# Patient Record
Sex: Male | Born: 1980 | Hispanic: Yes | Marital: Single | Smoking: Current every day smoker
Health system: Southern US, Community
[De-identification: ages and names within clinical notes are randomized; demographics above are authoritative.]

---

## 2015-08-15 ENCOUNTER — Emergency Department: Payer: Self-pay

## 2015-08-15 ENCOUNTER — Encounter: Payer: Self-pay | Admitting: Emergency Medicine

## 2015-08-15 ENCOUNTER — Emergency Department
Admission: EM | Admit: 2015-08-15 | Discharge: 2015-08-15 | Disposition: A | Discharge status: Home / Self Care | Payer: Self-pay | Attending: Emergency Medicine | Admitting: Emergency Medicine

## 2015-08-15 DIAGNOSIS — W108XXA Fall (on) (from) other stairs and steps, initial encounter: Secondary | ICD-10-CM | POA: Insufficient documentation

## 2015-08-15 DIAGNOSIS — S63281A Dislocation of proximal interphalangeal joint of left index finger, initial encounter: Secondary | ICD-10-CM | POA: Insufficient documentation

## 2015-08-15 DIAGNOSIS — F17210 Nicotine dependence, cigarettes, uncomplicated: Secondary | ICD-10-CM | POA: Insufficient documentation

## 2015-08-15 DIAGNOSIS — Y9301 Activity, walking, marching and hiking: Secondary | ICD-10-CM | POA: Insufficient documentation

## 2015-08-15 DIAGNOSIS — S63279A Dislocation of unspecified interphalangeal joint of unspecified finger, initial encounter: Secondary | ICD-10-CM

## 2015-08-15 DIAGNOSIS — S6991XA Unspecified injury of right wrist, hand and finger(s), initial encounter: Secondary | ICD-10-CM | POA: Insufficient documentation

## 2015-08-15 DIAGNOSIS — Y998 Other external cause status: Secondary | ICD-10-CM | POA: Insufficient documentation

## 2015-08-15 DIAGNOSIS — Y9289 Other specified places as the place of occurrence of the external cause: Secondary | ICD-10-CM | POA: Insufficient documentation

## 2015-08-15 MED ORDER — OXYCODONE-ACETAMINOPHEN 5-325 MG PO TABS
1.0000 | ORAL_TABLET | Freq: Once | ORAL | Status: AC
  Administered 2015-08-15: 1 via ORAL
  Filled 2015-08-15: qty 1

## 2015-08-15 MED ORDER — TRAMADOL HCL 50 MG PO TABS: 50.0000 mg | ORAL_TABLET | Freq: Four times a day (QID) | ORAL | Status: AC | PRN

## 2015-08-15 MED ORDER — LIDOCAINE HCL (PF) 1 % IJ SOLN
10.0000 mL | Freq: Once | INTRAMUSCULAR | Status: AC
  Administered 2015-08-15: 10 mL
  Filled 2015-08-15: qty 10

## 2015-08-15 MED ORDER — NAPROXEN 500 MG PO TABS: 500.0000 mg | ORAL_TABLET | Freq: Two times a day (BID) | ORAL | Status: AC

## 2015-08-15 NOTE — ED Notes (Addendum)
Pt fell on left index finger this morning about 15 mins PTA.  Obvious finger deformity noted. Pt reports slipping on wooden stairs that were wet this morning and fell on his hand.

## 2015-08-15 NOTE — Discharge Instructions (Signed)
Finger Dislocation Finger dislocation is the displacement of bones in your finger at the joints. Most commonly, finger dislocation occurs at the proximal interphalangeal joint (the joint closest to your knuckle). Very strong, fibrous tissues (ligaments) and joint capsules connect the three bones of your fingers.  CAUSES Dislocation is caused by a forceful impact. This impact moves these bones off the joint and often tears your ligaments.  SYMPTOMS Symptoms of finger dislocation include:  Deformity of your finger.  Pain, with loss of movement. DIAGNOSIS  Finger dislocation is diagnosed with a physical exam. Often, X-ray exams are done to see if you have associated injuries, such as bone fractures. TREATMENT  Finger dislocations are treated by putting your bones back into position (reduction) either by manually moving the bones back into place or through surgery. Your finger is then kept in a fixed position (immobilized) with the use of a dressing or splint for a brief period. When your ligament has to be surgically repaired, it needs to be kept in a fixed position with a dressing or splint for 1 to 2 weeks. Because joint stiffness is a long-term complication of finger dislocation, hand exercises or physical therapy to increase the range of motion and to regain strength is usually started as soon as the ligament is healed. Exercises and therapy generally last no more than 3 months. HOME CARE INSTRUCTIONS The following measures can help to reduce pain and speed up the healing process:  Rest your injured joint. Do not move until instructed otherwise by your caregiver. Avoid activities similar to the one that caused your injury.  Apply ice to your injured joint for the first day or 2 after your reduction or as directed by your caregiver. Applying ice helps to reduce inflammation and pain.  Put ice in a plastic bag.  Place a towel between your skin and the bag.  Leave the ice on for 15-20 minutes  at a time, every 2 hours while you are awake.  Elevate your hand above your heart as directed by your caregiver to reduce swelling.  Take over-the-counter or prescription medicine for pain as your caregiver instructs you. SEEK IMMEDIATE MEDICAL CARE IF:  Your dressing or splint becomes damaged.  Your pain becomes worse rather than better.  You lose feeling in your finger, or it becomes cold and white. MAKE SURE YOU:  Understand these instructions.  Will watch your condition.  Will get help right away if you are not doing well or get worse.   This information is not intended to replace advice given to you by your health care provider. Make sure you discuss any questions you have with your health care provider.   Document Released: 07/28/2000 Document Revised: 08/21/2014 Document Reviewed: 12/25/2014 Elsevier Interactive Patient Education 2016 [employer]  

## 2015-08-15 NOTE — ED Provider Notes (Signed)
[facility] Emergency Department Provider Note ?  ? ____________________________________________ ? Time seen: 10:38 AM ? I have reviewed the triage vital signs and the nursing notes.  ________ HISTORY ? Chief Complaint Finger Injury     HPI  Jack Matthews is a 35 y.o. male   who presents emergency department complaining of left index finger pain. He states that he was walking on Wednesdays when he slipped, falling on an outstretched hand. He reports hitting the end of his right index finger. He states that he has an obvious deformity and pain to the PIP joint. Patient also endorses some moderate right wrist pain. He denies numbness or tingling to the digits. Patient rates he did not hit his head or lose consciousness. No other injury or complaint at this time. ? ? ? History reviewed. No pertinent past medical history.  There are no active problems to display for this patient.  ? History reviewed. No pertinent past surgical history. ? Current Outpatient Rx  Name  Route  Sig  Dispense  Refill  . naproxen (NAPROSYN) 500 MG tablet   Oral   Take 1 tablet (500 mg total) by mouth 2 (two) times daily with a meal.   60 tablet   0   . traMADol (ULTRAM) 50 MG tablet   Oral   Take 1 tablet (50 mg total) by mouth every 6 (six) hours as needed.   10 tablet   0    ? Allergies Review of patient's allergies indicates no known allergies. ? History reviewed. No pertinent family history. ? Social History Social History  Substance Use Topics  . Smoking status: Current Every Day Smoker -- 0.50 packs/day    Types: Cigarettes  . Smokeless tobacco: None  . Alcohol Use: Yes   ? Review of Systems Constitutional: no fever. Eyes: no discharge ENT: no sore throat. Cardiovascular: no chest pain. Respiratory: no cough. No sob Gastrointestinal: denies abdominal pain, vomiting, diarrhea, and constipation Genitourinary: no dysuria. Negative for  hematuria Musculoskeletal: Negative for back pain. Endorses left index finger pain. Endorses right wrist pain. Skin: Negative for rash. Neurological: Negative for headaches  10-point ROS otherwise negative.  _______________ PHYSICAL EXAM: ? VITAL SIGNS:   ED Triage Vitals  Enc Vitals Group     BP 08/15/15 0805 127/86 mmHg     Pulse Rate 08/15/15 0805 118     Resp 08/15/15 0805 22     Temp 08/15/15 0805 98 F (36.7 C)     Temp Source 08/15/15 0805 Oral     SpO2 08/15/15 0805 95 %     Weight 08/15/15 0805 182 lb (82.555 kg)     Height 08/15/15 0805 6\' 1"  (1.854 m)     Head Cir --      Peak Flow --      Pain Score 08/15/15 0806 10     Pain Loc --      Pain Edu? --      Excl. in GC? --    ?  Constitutional: Alert and oriented. Well appearing and in no distress. Eyes: Conjunctivae are normal.  ENT      Head: Normocephalic and atraumatic.      Ears:       Nose: No congestion/rhinnorhea.      Mouth/Throat: Mucous membranes are moist.   Hematological/Lymphatic/Immunilogical: No cervical lymphadenopathy. Cardiovascular: Normal rate, regular rhythm. Normal S1 and S2. Respiratory: Normal respiratory effort without tachypnea nor retractions. Lungs CTAB. Gastrointestinal: Soft and nontender. No distention. There  is no CVA tenderness. Genitourinary:  Musculoskeletal: Nontender with normal range of motion in all extremities. Left index finger is obviously deformed upon initial inspection. Deformity is just proximal to the PIP joint. Sensation and capillary refill are intact at the distal aspect of the finger. No tenderness to palpation over the carpal or metacarpal bones. Palpable abnormality over the distal proximal phalanx. Physical deformity to right wrist and inspection. Patient is mildly tender to palpation over the proximal carpal bones. No palpable abnormality. Full range of motion of wrist. Capillary refill and sensation are intact 5 digits. Good radial pulse.  Neurologic:   Normal speech and language. No gross focal neurologic deficits are appreciated. Skin:  Skin is warm, dry and intact. No rash noted. Psychiatric: Mood and affect are normal. Speech and behavior are normal. Patient exhibits appropriate insight and judgment.    LABS (all labs ordered are listed, but only abnormal results are displayed)  Labs Reviewed - No data to display  ___________ RADIOLOGY  Left index finger x-ray Impression: Posterior dislocation in the middle phalanx relative to the proximal phalanx of left index finger.   Left index finger x-ray; postreduction films Impression: Successful reduction of dislocation involving the phalanx of the left index finger.  Right wrist x-ray Impression: No acute osseous abnormality.   I have personally reviewed the images. _____________ PROCEDURES ? Procedure(s) performed:    Index Finger Reduction Digital block was performed using 1% lidocaine. 7 ML's of lidocaine were used. Good anesthesia to finger. Distal traction was applied and middle phalanx was guided back into proper anatomical position. Good range of motion to digit status post reduction. Patient tolerated procedure well. Index and third digits are buddy taped to provide stability. Postreduction films are ordered. Finger is splinted in place.   Medications  oxyCODONE-acetaminophen (PERCOCET/ROXICET) 5-325 MG per tablet 1 tablet (1 tablet Oral Given 08/15/15 0818)  lidocaine (PF) (XYLOCAINE) 1 % injection 10 mL (10 mLs Infiltration Given by Other 08/15/15 1024)    ______________________________________________________ INITIAL IMPRESSION / ASSESSMENT AND PLAN / ED COURSE ? Pertinent labs & imaging results that were available during my care of the patient were reviewed by me and considered in my medical decision making (see chart for details).    She presents emergency Department with left index finger pain. Obvious deformity was noted. X-rays reveal dislocation of the middle  phalanx. Patient received a digital block, and finger was manually reduced. Postreduction films reveal successful reduction of dislocation. After successful reduction of the distracting injury patient endorsed right wrist pain. There is no obvious abnormality to palpation. X-ray was performed and revealed no acute osseous abnormality. Patient's index finger is splinted in the emergency department and he is given instructions to wear splint for a week. Patient being slightly return to normal activity with hand. There is any continual symptoms patient is to follow-up with orthopedics.    Discharge Medication List as of 08/15/2015 10:15 AM    START taking these medications   Details  naproxen (NAPROSYN) 500 MG tablet Take 1 tablet (500 mg total) by mouth 2 (two) times daily with a meal., Starting 08/15/2015, Until Discontinued, Print    traMADol (ULTRAM) 50 MG tablet Take 1 tablet (50 mg total) by mouth every 6 (six) hours as needed., Starting 08/15/2015, Until Discontinued, Print       ____________________________________________ FINAL CLINICAL IMPRESSION(S) / ED DIAGNOSES?  Final diagnoses:  Dislocation of finger, interphalangeal joint, left, closed, initial encounter  Delorise Royals Haidan Nhan, PA-C 08/15/15 1038  Jennye Moccasin, MD 08/15/15 [phone]

## 2017-01-30 IMAGING — CR DG FINGER INDEX 2+V*L*
1 series · 3 of 3 positions shown · non-contrast
Comparison: None.

CLINICAL DATA: Left index finger pain and deformity after fall last
night.

EXAM:
LEFT INDEX FINGER 2+V

[Series 1: pa · 0.17mm/px · 3 of 3 slices shown]
[im 1/3]
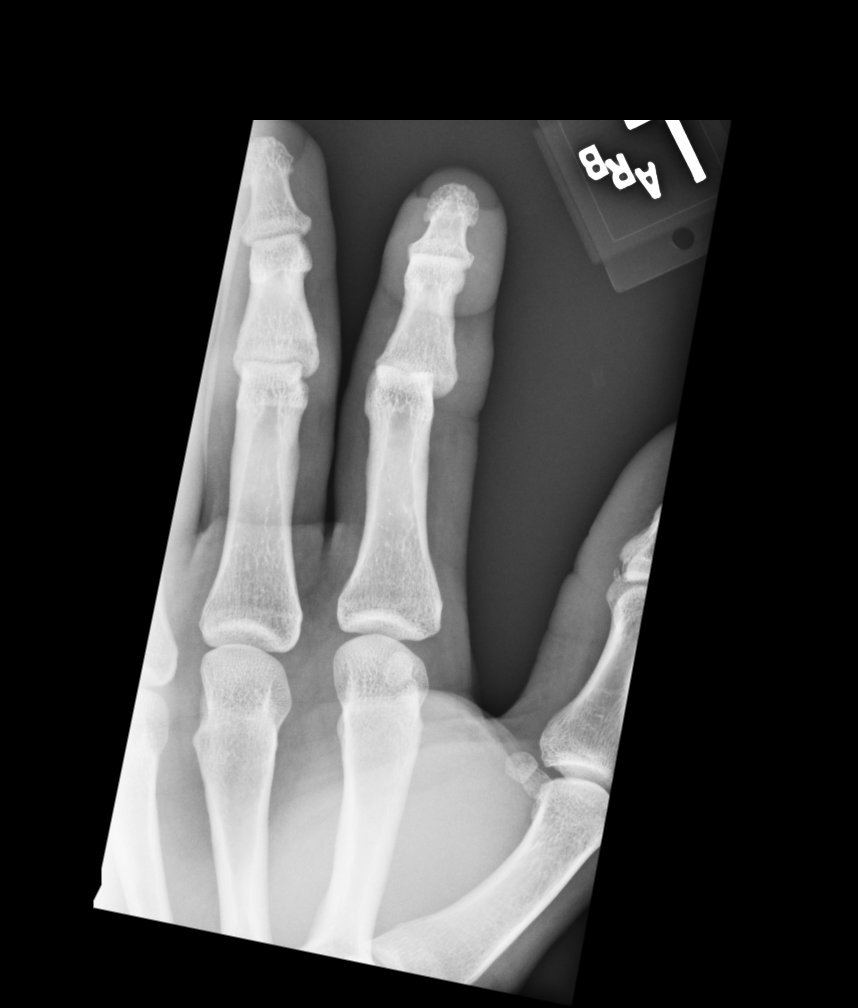
[im 2/3]
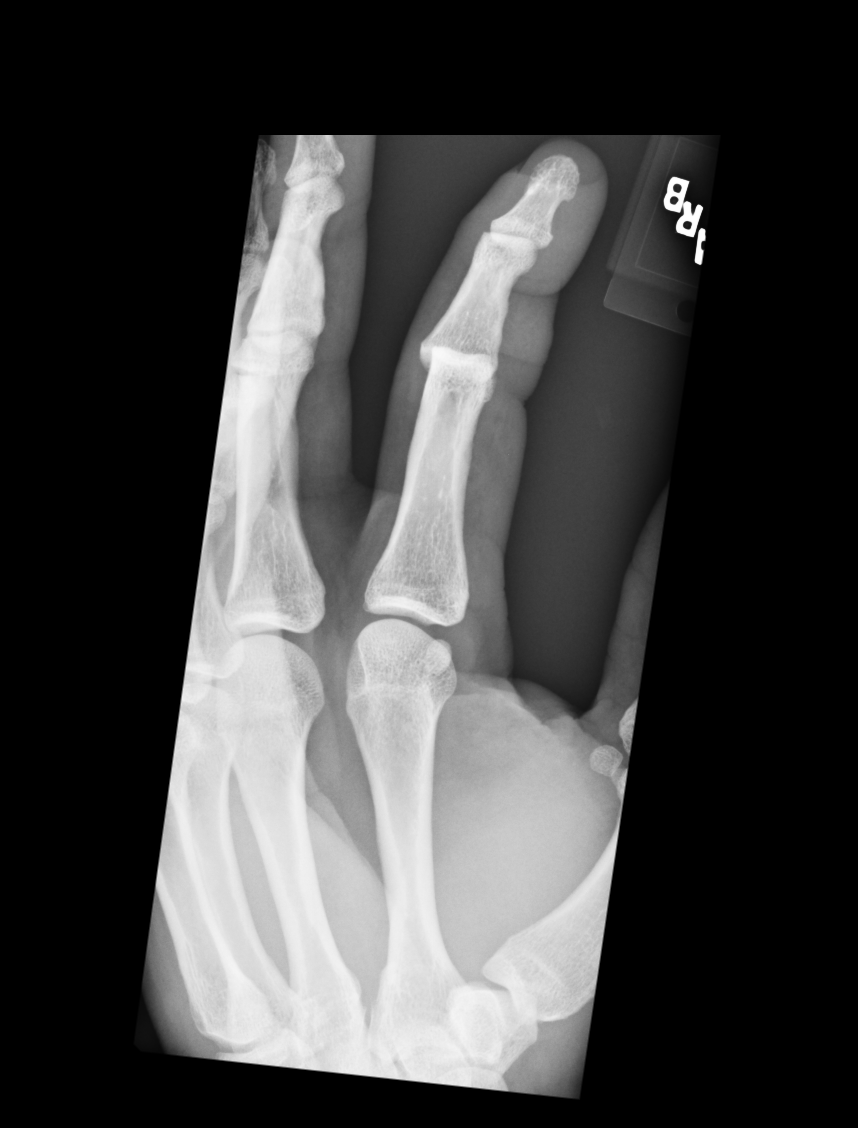
[im 3/3]
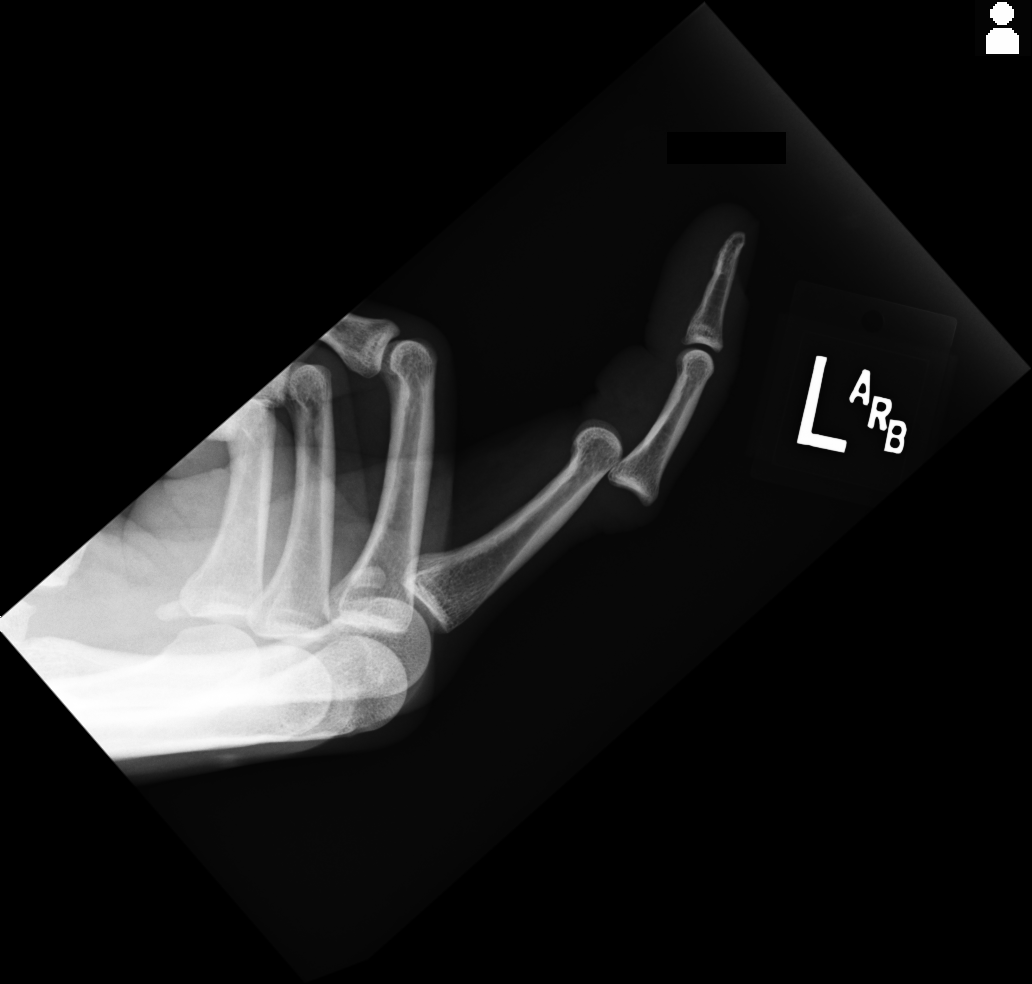

[3 of 3 positions shown; findings below may reference images not displayed]

FINDINGS: Complete posterior dislocation of second middle phalanx relative to
second proximal phalanx is noted. No fracture is noted. No soft
tissue abnormality is noted.
IMPRESSION: Posterior dislocation of middle phalanx relative to proximal phalanx
of left index finger.

## 2017-01-30 IMAGING — CR DG WRIST COMPLETE 3+V*R*
1 series · 4 of 4 positions shown · non-contrast
Comparison: None.

CLINICAL DATA: Acute right wrist pain after fall on stairs today.
Initial encounter.

EXAM:
RIGHT WRIST - COMPLETE 3+ VIEW

[Series 1: pa · 0.17mm/px · 4 of 4 slices shown]
[im 1/4]
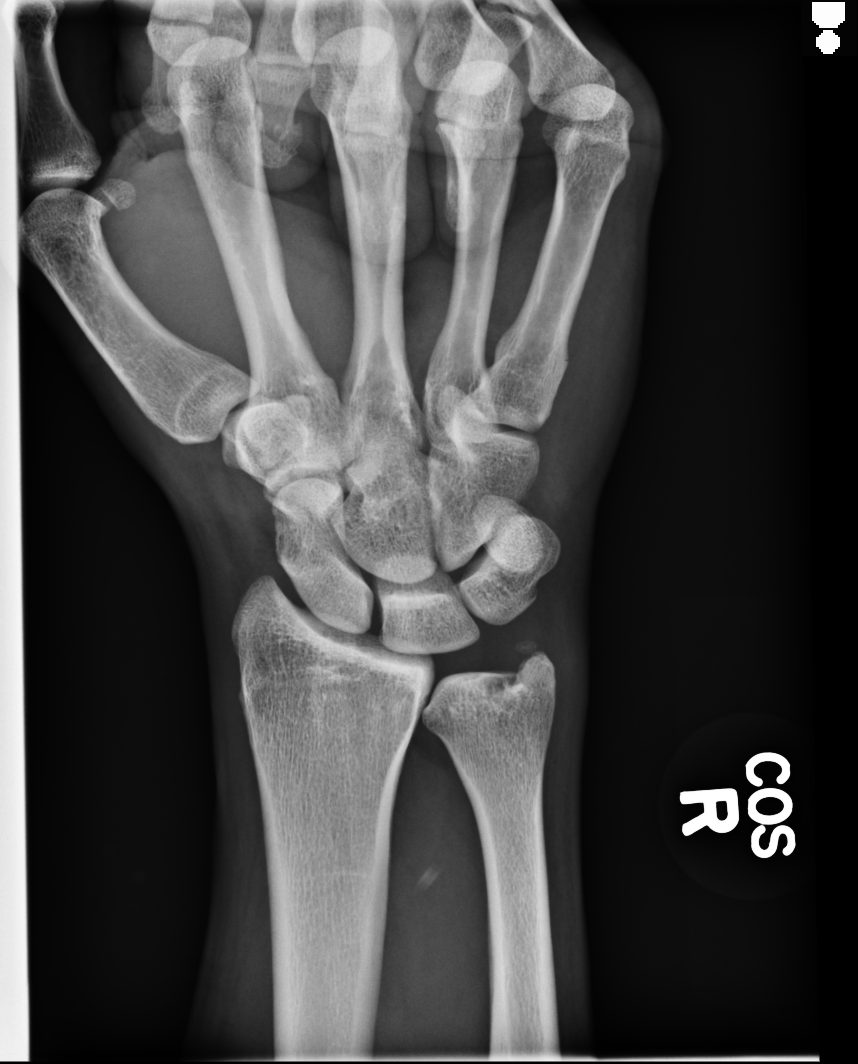
[im 2/4]
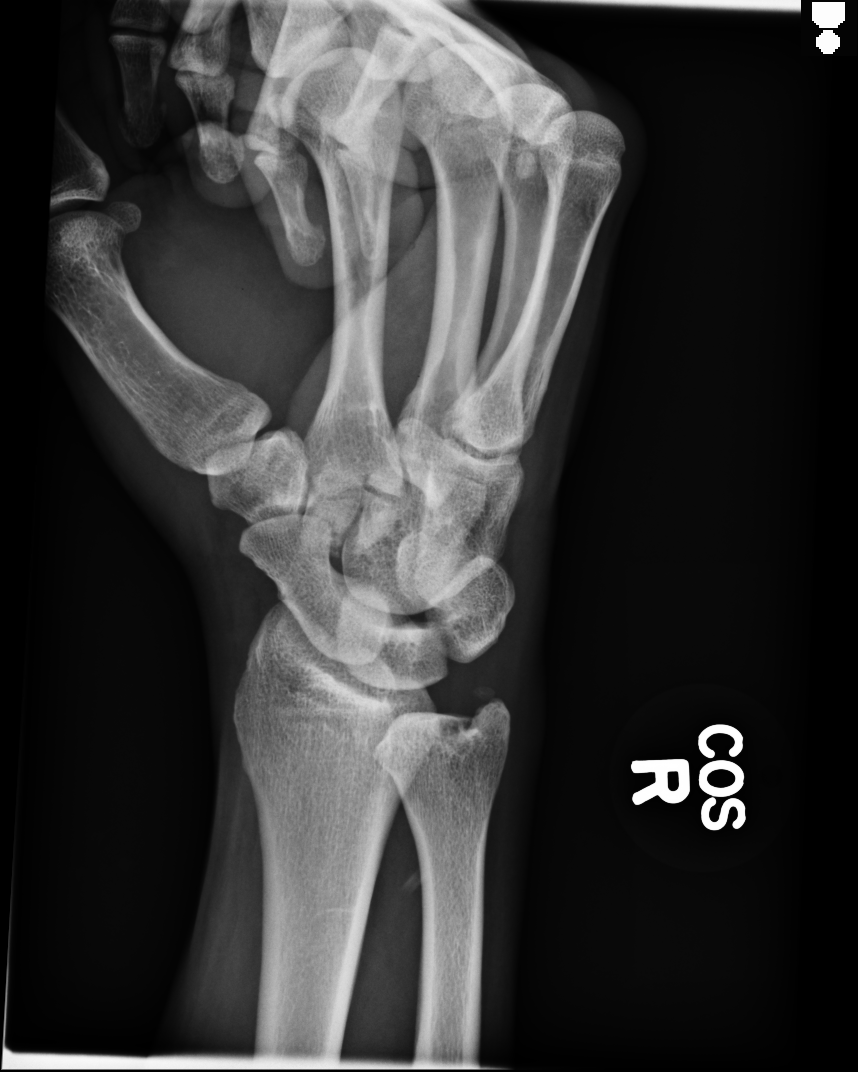
[im 3/4]
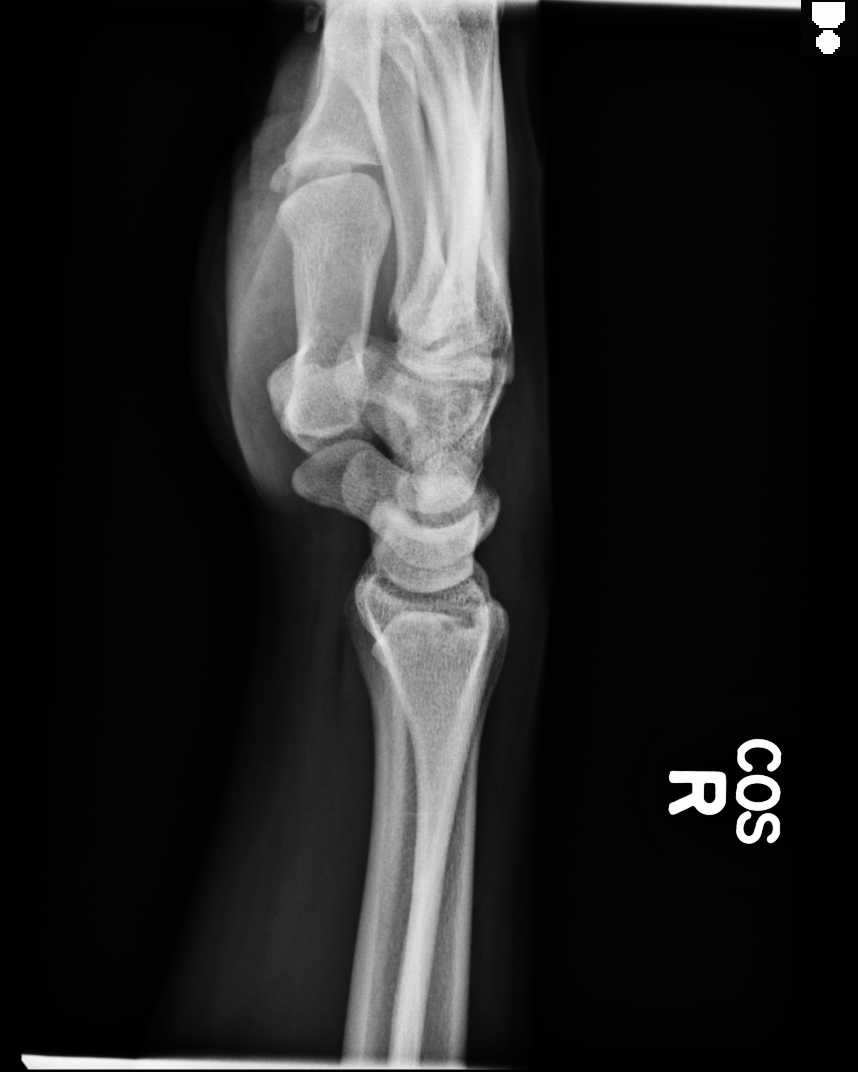
[im 4/4]
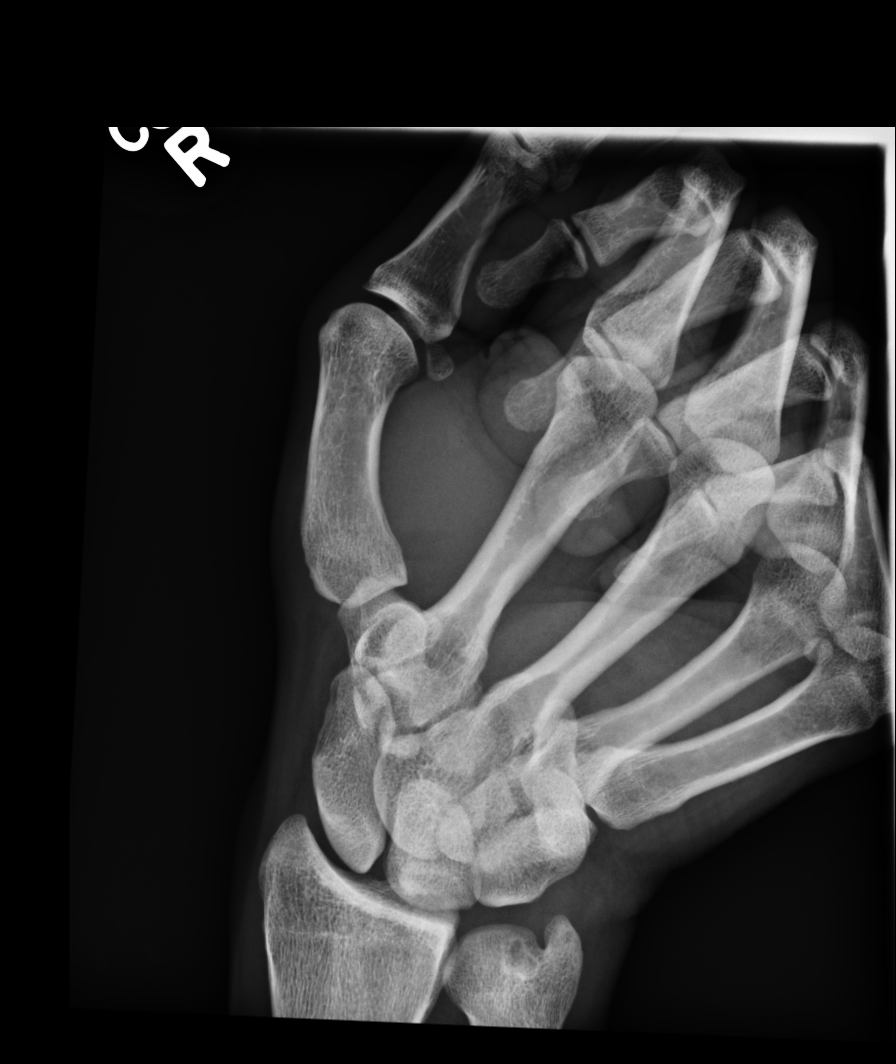

[4 of 4 positions shown; findings below may reference images not displayed]

FINDINGS: There is no evidence of fracture or dislocation. There is no
evidence of arthropathy or other focal bone abnormality. Soft
tissues are unremarkable.
IMPRESSION: Normal right wrist.

## 2017-01-30 IMAGING — CR DG FINGER INDEX 2+V*L*
1 series · 3 of 3 positions shown · non-contrast
Comparison: Same day.

CLINICAL DATA: Status post reduction.

EXAM:
LEFT INDEX FINGER 2+V

[Series 1: pa · 0.17mm/px · 3 of 3 slices shown]
[im 1/3]
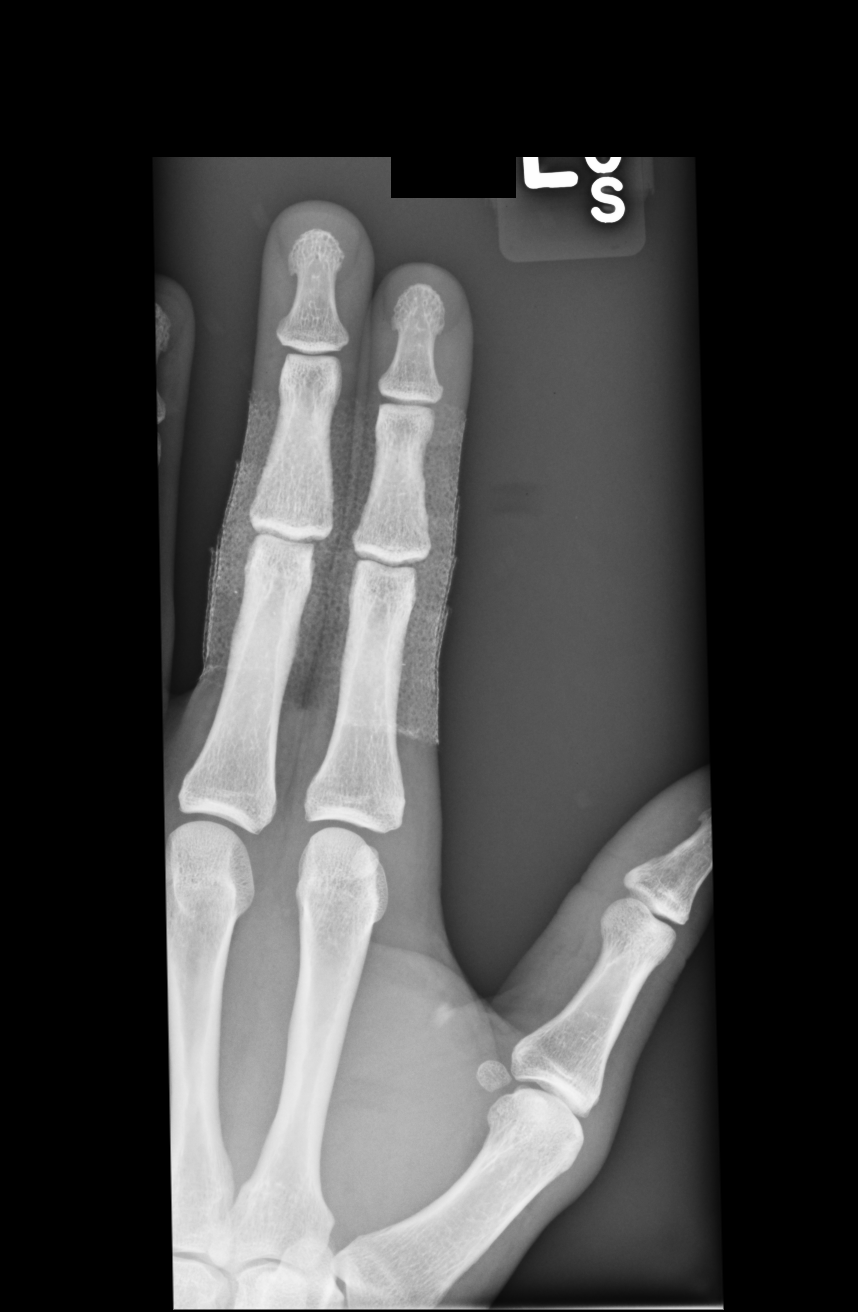
[im 2/3]
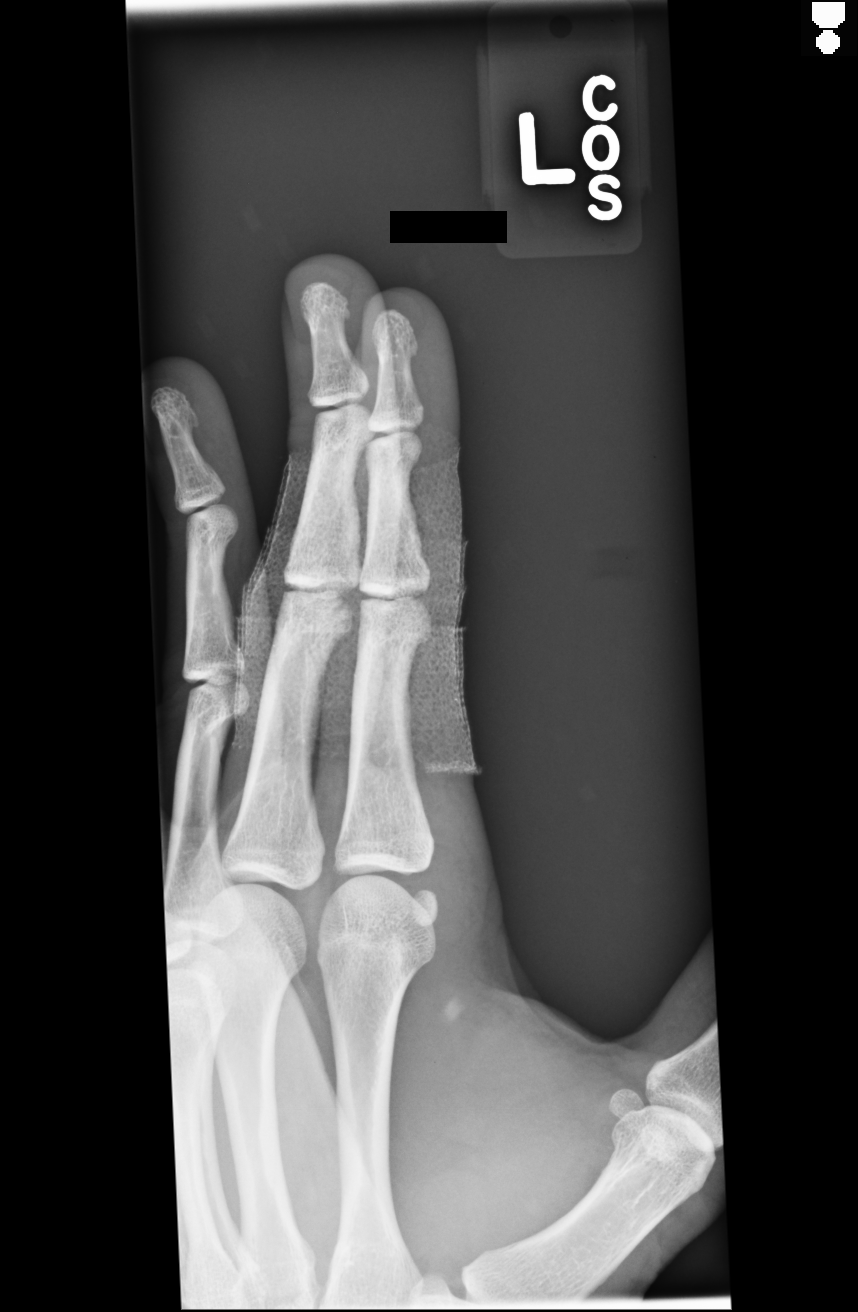
[im 3/3]
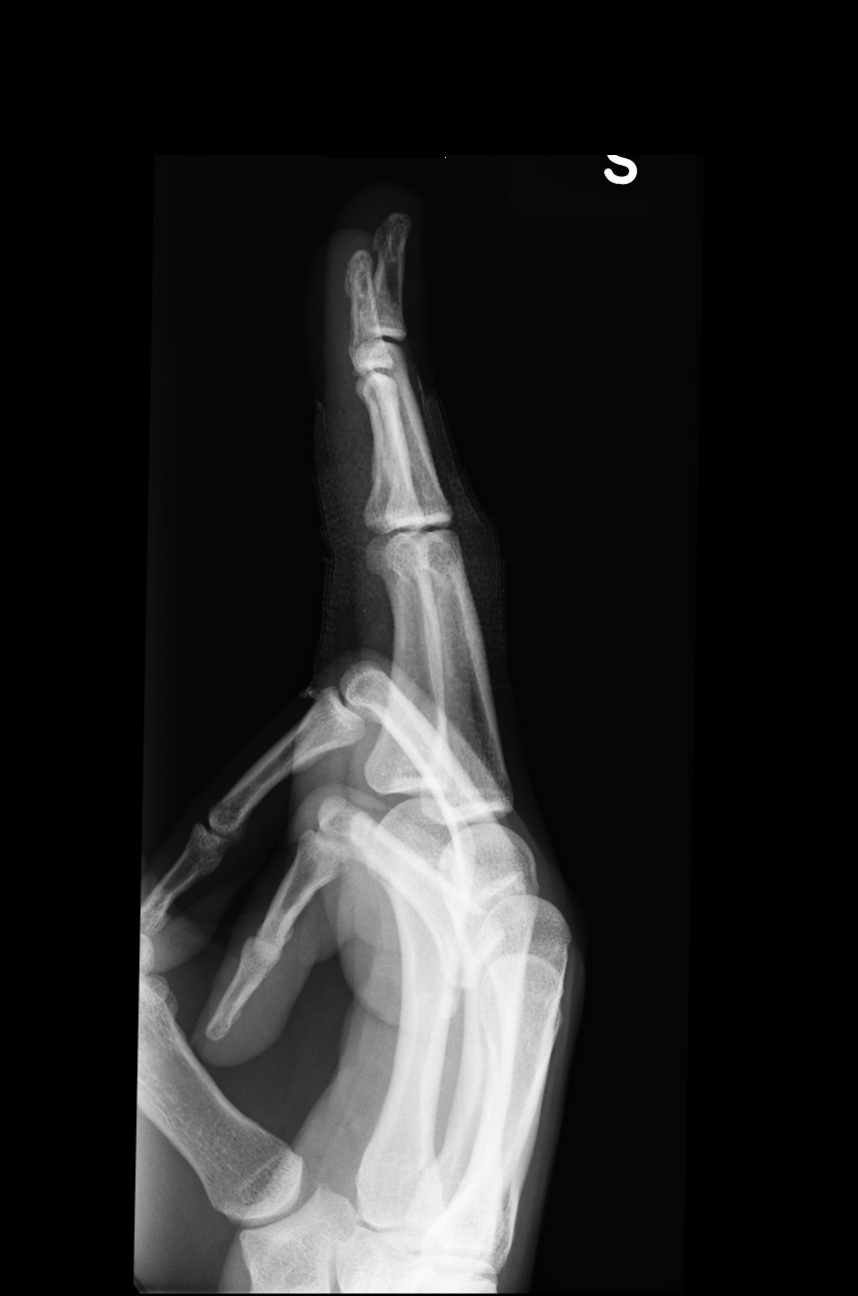

[3 of 3 positions shown; findings below may reference images not displayed]

FINDINGS: There has been successful reduction of previously described
dislocation of middle phalanx. No fracture is noted.
IMPRESSION: Successful reduction of dislocation involving middle phalanx of left
index finger.
